# Patient Record
Sex: Male | Born: 1975 | Race: White | Hispanic: Yes | Marital: Single | Smoking: Current every day smoker
Health system: Southern US, Community
[De-identification: ages and names within clinical notes are randomized; demographics above are authoritative.]

---

## 2014-03-01 ENCOUNTER — Emergency Department (HOSPITAL_BASED_OUTPATIENT_CLINIC_OR_DEPARTMENT_OTHER)
Admission: EM | Admit: 2014-03-01 | Discharge: 2014-03-01 | Disposition: A | Discharge status: Home / Self Care | Payer: 59 | Attending: Emergency Medicine | Admitting: Emergency Medicine

## 2014-03-01 ENCOUNTER — Encounter (HOSPITAL_BASED_OUTPATIENT_CLINIC_OR_DEPARTMENT_OTHER): Payer: Self-pay | Admitting: Emergency Medicine

## 2014-03-01 ENCOUNTER — Emergency Department (HOSPITAL_BASED_OUTPATIENT_CLINIC_OR_DEPARTMENT_OTHER): Payer: 59

## 2014-03-01 DIAGNOSIS — K5289 Other specified noninfective gastroenteritis and colitis: Secondary | ICD-10-CM | POA: Insufficient documentation

## 2014-03-01 DIAGNOSIS — Z880 Allergy status to penicillin: Secondary | ICD-10-CM | POA: Insufficient documentation

## 2014-03-01 DIAGNOSIS — F17200 Nicotine dependence, unspecified, uncomplicated: Secondary | ICD-10-CM | POA: Insufficient documentation

## 2014-03-01 DIAGNOSIS — K529 Noninfective gastroenteritis and colitis, unspecified: Secondary | ICD-10-CM

## 2014-03-01 LAB — URINALYSIS, ROUTINE W REFLEX MICROSCOPIC
Bilirubin Urine: NEGATIVE
Glucose, UA: NEGATIVE mg/dL
Ketones, ur: NEGATIVE mg/dL
LEUKOCYTES UA: NEGATIVE
Nitrite: NEGATIVE
Protein, ur: NEGATIVE mg/dL
SPECIFIC GRAVITY, URINE: 1.019 (ref 1.005–1.030)
UROBILINOGEN UA: 0.2 mg/dL (ref 0.0–1.0)
pH: 7.5 (ref 5.0–8.0)

## 2014-03-01 LAB — CBC WITH DIFFERENTIAL/PLATELET
Basophils Absolute: 0 10*3/uL (ref 0.0–0.1)
Basophils Relative: 0 % (ref 0–1)
EOS ABS: 0.3 10*3/uL (ref 0.0–0.7)
EOS PCT: 4 % (ref 0–5)
HEMATOCRIT: 43.1 % (ref 39.0–52.0)
Hemoglobin: 15.2 g/dL (ref 13.0–17.0)
Lymphocytes Relative: 39 % (ref 12–46)
Lymphs Abs: 2.9 10*3/uL (ref 0.7–4.0)
MCH: 29.4 pg (ref 26.0–34.0)
MCHC: 35.3 g/dL (ref 30.0–36.0)
MCV: 83.4 fL (ref 78.0–100.0)
MONO ABS: 0.7 10*3/uL (ref 0.1–1.0)
Monocytes Relative: 9 % (ref 3–12)
Neutro Abs: 3.7 10*3/uL (ref 1.7–7.7)
Neutrophils Relative %: 49 % (ref 43–77)
Platelets: 231 10*3/uL (ref 150–400)
RBC: 5.17 MIL/uL (ref 4.22–5.81)
RDW: 12.4 % (ref 11.5–15.5)
WBC: 7.5 10*3/uL (ref 4.0–10.5)

## 2014-03-01 LAB — COMPREHENSIVE METABOLIC PANEL
ALT: 49 U/L (ref 0–53)
AST: 26 U/L (ref 0–37)
Albumin: 4 g/dL (ref 3.5–5.2)
Alkaline Phosphatase: 82 U/L (ref 39–117)
Anion gap: 11 (ref 5–15)
BUN: 12 mg/dL (ref 6–23)
CALCIUM: 9.4 mg/dL (ref 8.4–10.5)
CO2: 27 meq/L (ref 19–32)
CREATININE: 0.9 mg/dL (ref 0.50–1.35)
Chloride: 103 mEq/L (ref 96–112)
Glucose, Bld: 103 mg/dL — ABNORMAL HIGH (ref 70–99)
Potassium: 4.5 mEq/L (ref 3.7–5.3)
SODIUM: 141 meq/L (ref 137–147)
TOTAL PROTEIN: 7.4 g/dL (ref 6.0–8.3)
Total Bilirubin: 0.4 mg/dL (ref 0.3–1.2)

## 2014-03-01 LAB — LIPASE, BLOOD: LIPASE: 18 U/L (ref 11–59)

## 2014-03-01 LAB — URINE MICROSCOPIC-ADD ON

## 2014-03-01 MED ORDER — METRONIDAZOLE 500 MG PO TABS: 500.0000 mg | ORAL_TABLET | Freq: Two times a day (BID) | ORAL | Status: AC

## 2014-03-01 MED ORDER — METRONIDAZOLE 500 MG PO TABS
500.0000 mg | ORAL_TABLET | Freq: Two times a day (BID) | ORAL | Status: DC
Start: 2014-03-01 — End: 2014-03-01

## 2014-03-01 MED ORDER — CIPROFLOXACIN HCL 500 MG PO TABS: 500.0000 mg | ORAL_TABLET | Freq: Two times a day (BID) | ORAL | Status: AC

## 2014-03-01 MED ORDER — CIPROFLOXACIN HCL 500 MG PO TABS: 500.0000 mg | ORAL_TABLET | Freq: Two times a day (BID) | ORAL | Status: DC

## 2014-03-01 NOTE — Discharge Instructions (Signed)
Colitis °Colitis is inflammation of the colon. Colitis can be a short-term or long-standing (chronic) illness. Crohn's disease and ulcerative colitis are 2 types of colitis which are chronic. They usually require lifelong treatment. °CAUSES  °There are many different causes of colitis, including: °· Viruses. °· Germs (bacteria). °· Medicine reactions. °SYMPTOMS  °· Diarrhea. °· Intestinal bleeding. °· Pain. °· Fever. °· Throwing up (vomiting). °· Tiredness (fatigue). °· Weight loss. °· Bowel blockage. °DIAGNOSIS  °The diagnosis of colitis is based on examination and stool or blood tests. X-rays, CT scan, and colonoscopy may also be needed. °TREATMENT  °Treatment may include: °· Fluids given through the vein (intravenously). °· Bowel rest (nothing to eat or drink for a period of time). °· Medicine for pain and diarrhea. °· Medicines (antibiotics) that kill germs. °· Cortisone medicines. °· Surgery. °HOME CARE INSTRUCTIONS  °· Get plenty of rest. °· Drink enough water and fluids to keep your urine clear or pale yellow. °· Eat a well-balanced diet. °· Call your caregiver for follow-up as recommended. °SEEK IMMEDIATE MEDICAL CARE IF:  °· You develop chills. °· You have an oral temperature above 102° F (38.9° C), not controlled by medicine. °· You have extreme weakness, fainting, or dehydration. °· You have repeated vomiting. °· You develop severe belly (abdominal) pain or are passing bloody or tarry stools. °MAKE SURE YOU:  °· Understand these instructions. °· Will watch your condition. °· Will get help right away if you are not doing well or get worse. °Document Released: 09/21/2004 Document Revised: 11/06/2011 Document Reviewed: 12/17/2009 °ExitCare® Patient Information ©2015 [employer]. This information is not intended to replace advice given to you by your health care provider. Make sure you discuss any questions you have with your health care provider. ° °Emergency Department Resource Guide °1) Find a Doctor and  Pay Out of Pocket °Although you won't have to find out who is covered by your insurance plan, it is a good idea to ask around and get recommendations. You will then need to call the office and see if the doctor you have chosen will accept you as a new patient and what types of options they offer for patients who are self-pay. Some doctors offer discounts or will set up payment plans for their patients who do not have insurance, but you will need to ask so you aren't surprised when you get to your appointment. ° °2) Contact Your Local Health Department °Not all health departments have doctors that can see patients for sick visits, but many do, so it is worth a call to see if yours does. If you don't know where your local health department is, you can check in your phone book. The CDC also has a tool to help you locate your state's health department, and many state websites also have listings of all of their local health departments. ° °3) Find a Walk-in Clinic °If your illness is not likely to be very severe or complicated, you may want to try a walk in clinic. These are popping up all over the country in pharmacies, drugstores, and shopping centers. They're usually staffed by nurse practitioners or physician assistants that have been trained to treat common illnesses and complaints. They're usually fairly quick and inexpensive. However, if you have serious medical issues or chronic medical problems, these are probably not your best option. ° °No Primary Care Doctor: °- Call [facility] at  832-8000 - they can help you locate a primary care doctor that  accepts your   insurance, provides certain services, etc. °- Physician Referral Service- 1-800-533-3463 ° °Chronic Pain Problems: °Organization         Address  Phone   Notes  °[facility]  (336) 297-2271 Patients need to be referred by their primary care doctor.  ° °Medication Assistance: °Organization         Address  Phone   Notes  °[place] [facility] [address] °[place], [place] [address] (336) 641-8030 --Must be a resident of [place] °-- Must have NO insurance coverage whatsoever (no Medicaid/ Medicare, etc.) °-- The pt. MUST have a primary care doctor that directs their care regularly and follows them in the community °  °[facility]  (866) 331-1348   °[employer]  (888) 892-1162   ° °Agencies that provide inexpensive medical care: °Organization         Address  Phone   Notes  °[facility]  (336) 832-8035   °[facility]    (336) 832-7272   °[facility] [address] °[place], [place] [address] (336) 832-4777   °[facility] [address], °[place] (336) 271-4999   °[facility]    (336) 373-0678   °[facility]    (336) 272-1050   °[facility] ° [address], [place] Phone:  (336) 832-4444, Fax:  (336) 832-4440 Hours of Operation:  9 am - 6 pm, M-F.  Also accepts Medicaid/Medicare and self-pay.  °[facility] ° [address], [place] Phone: (336) 832-3150, Fax: (336) 832-3151. Hours of Operation:  8:30 am - 5:30 pm, M-F.  Also accepts Medicaid and self-pay.  °[facility] [place] [address], [place] Phone: (336) 878-6027   °[facility] [address], [place], [place] (336)723-1848, Ext. 123 Mondays & Thursdays: 7-9 AM.  First 15 patients are seen on a first come, first serve basis. °  ° °Medicaid-accepting [place] Providers: ° °Organization         Address  Phone   Notes  °[facility] [address], [place] (336) 641-2100 Also accepts self-pay patients.  °[facility] [address], [place] ° (336) 856-9996   °[facility] [address], [place] (336) 288-8857   °[facility] [address] [place] [address], [place] (336) 299-7000   °Veita Bland [address], [place]  ° (336) 373-1557 Only accepts [place] Access Medicaid patients after they have their name applied to their card.  ° °Self-Pay (no insurance) in [place]: ° °Organization         Address  Phone   Notes  °Sickle Cell Patients, [facility] [address], [place] (336) 832-1970   °[facility] [address], [place] (336) 832-4400   °[facility] ° [address], [place] (336) 992-4800   °[facility]/Dr. Osei-Bonsu ° [address] [place] [address], [place] or [address], [place] (336) 841-8500 Phone number for both [place] and [place] locations is the same.  °[facility] [address], [place] (336) 299-0000   °[facility] [address] [place] [address], [place] or [address] (336) 852-7530 °(336) 878-2260   °[facility] [address], [place] (336) 350-1642, phone; (336) 294-5005, fax Sees patients 1st and 3rd Saturday of every month.  Must not qualify for public or private insurance (i.e. Medicaid, Medicare, [place] Health Choice, Veterans' Benefits) • Household income should be no more than 200% of the poverty level •The clinic cannot treat you if you are pregnant or think you are pregnant • Sexually transmitted diseases are not treated at the clinic.  ° ° °Dental Care: °Organization         Address  Phone  Notes  °[facility] [address], [place] (336) 641-6152 Accepts children up to age 21 who are enrolled in Medicaid or [place] Health Choice; pregnant women with a Medicaid card; and children who have applied for Medicaid or [place] Health Choice, but were declined, whose parents can pay a reduced fee at time of service.  °[facility] [place]  [address], [place] (336) 641-7733 Accepts children up to age 21 who are enrolled in Medicaid or [place] Health Choice; pregnant women with a Medicaid card; and children who have applied for Medicaid or [place] Health Choice, but were declined, whose parents can pay a reduced fee at time of service.  °[facility] ° [address], [place] (336) 641-4533 Patients are seen by appointment only. Walk-ins are not accepted. [facility] will see patients 18 years of age and older. °Monday - Tuesday (8am-5pm) °Most Wednesdays (8:30-5pm) °$30 per visit, cash only  °[facility] ° [address], [place] (336) 641-4533 Patients are seen by appointment only. Walk-ins are not accepted. [facility] will see patients 18 years of age and older. °One Wednesday Evening (Monthly: Volunteer Based).  $30 per visit, cash only  °[facility]  (919) 537-3737 for adults; Children under age 4, call Graduate Pediatric Dentistry at (919) [phone]. Children aged 4-14, please call (919) [phone] to request a pediatric application. ° Dental services are provided in all areas of dental care including fillings, crowns and bridges, complete and partial dentures, implants, gum treatment, root canals, and extractions. Preventive care is also provided. Treatment is provided to both adults and children. °Patients are selected via a lottery and there is often a waiting list. °  °[facility] [address], °[place] ° (336) 763-8833 [email or web address] °  °[facility] [address], [place], [place] (336)723-1848, Ext. 123 Second and Fourth Thursday of each month, opens at 6:30 AM; Clinic ends at 9 AM.  Patients are seen on a first-come first-served basis, and a limited number are seen during each clinic.  ° °[facility] ° [address], [place], [place] (336) 723-7904   Eligibility Requirements °You must have lived in [place], [place], or  [place] counties for at least the last three months. °  You cannot be eligible for state or federal sponsored healthcare insurance, including Veterans Administration, Medicaid, or Medicare. °  You generally cannot be eligible for healthcare insurance through your employer.  °  How to apply: °Eligibility screenings are held every Tuesday and Wednesday afternoon from 1:00 pm until 4:00 pm. You do not need an appointment for the interview!  °[facility] [address], [place],  336-631-2330   °[facility]  336-342-8273   °[facility]  336-703-3100   °[facility]  336-570-6415   ° °  Behavioral Health Resources in the Community: °Intensive Outpatient Programs °Organization         Address  Phone  Notes  °[facility] [address], [place], [place] 336-878-6098   °[facility] [address], [place], [place] 336-832-9800   °[facility] [address], [place], [place] ° 336-882-2125   °[facility] [address],  °[place], [place] 1-800-853-5163 or 336-641-4981   °Substance Abuse Resources °Organization         Address  Phone  Notes  °[facility]  336-882-2125   °[facility]  336-784-9470   °[facility]  336-285-9073   °[facility]  336-845-3988   °Residential & Outpatient Substance Abuse Program  1-800-659-3381   °Psychological Services °Organization         Address  Phone  Notes  °[facility]  336- 832-9600   °[facility]  336- 378-7881   °[facility] [address], [place] 1-800-853-5163 or 336-641-4981   ° °Mobile Crisis Teams °Organization         Address  Phone  Notes  °[facility], Mobile Crisis Care Unit  1-877-626-1772   °Assertive °[facility] ° [address] [place], [place] 336-834-9664   °Sharon DeEsch [address] °[place] 336-554-5454   ° °Self-Help/Support Groups °Organization         Address  Phone             Notes  °[facility] [place] - variety of support groups  336- 373-1402 Call for more information  °Narcotics Anonymous (NA), [facility] [address], °[place]  2 meetings at this location  ° °Residential Treatment Programs °Organization         Address  Phone  Notes  °[facility] [address],    °[place]  1-866-801-8205   °[facility] ° [address], [place], [place] 704-293-8524   °[facility] [address], [place] 336-845-3988 Admissions: 8am-3pm M-F  °[facility] [address]    °[place], [place] 336-841-1104   °[facility] [address], [place], [place] 336-379-7146   °[facility] [address]  °[place], [place] 336-285-9073   °[facility] Intensive Outpatient [address], [place], [place] 336-852-3033   °[facility] [address]  °[place], [place] 1-877-615-2722 or 336-784-9470   °[facility] [address] [place], [place] 336-227-7417 Accepts Medicaid  °[facility] [address]  ° [place] 1-800-659-3381 Substance Abuse/Addiction Treatment  ° °[place] Behavioral Health Resources °Organization         Address  Phone  Notes  °[facility]  (888) 581-9988   °Julie Brannon, PhD [address] [place], [place]   (336) 349-5553 or (336) 951-0000   °[facility]   [address] °[place], [place] (336) 349-4454   °[facility] [address], [place], [place] (336) 342-8316 Insurance/Medicaid/sponsorship through [facility]  °[facility] [address]                                    [place], [place] (336) 342-8316 Therapy/tele-psych/case  °[facility] [address]  ° [place],   [place] (336) 349-2233     °Dr. Arfeen  (336) 349-4544   °[facility] [place]  [employer] [place] [facility] 1) [address], [place] °2) [address], [place] °3)  [address], [place] (336) 349-3220 °(336) 342-7768 ° °(336) 342-8140   °[place] [facility] (336) 342-1394 or (336) 342-3537 (After Hours)    ° ° °

## 2014-03-01 NOTE — ED Notes (Signed)
Patient transported to CT 

## 2014-03-01 NOTE — ED Provider Notes (Signed)
CSN: [id number]     Arrival date & time 03/01/14  [id number] History   First MD Initiated Contact with Patient 03/01/14 1056     Chief Complaint  Patient presents with  . Abdominal Pain     (Consider location/radiation/quality/duration/timing/severity/associated sxs/prior Treatment) Patient is a 38 y.o. male presenting with abdominal pain.  Abdominal Pain Pain location:  LLQ Pain quality: cramping   Pain radiates to:  Does not radiate Pain severity:  Moderate Onset quality:  Sudden Duration:  2 days Timing:  Intermittent Progression:  Waxing and waning Context: not eating, not recent travel, not retching, not sick contacts and not trauma   Relieved by:  Nothing Worsened by:  Position changes Ineffective treatments:  OTC medications Associated symptoms: no chills, no diarrhea, no dysuria, no fever, no hematochezia, no hematuria, no nausea and no vomiting     History reviewed. No pertinent past medical history. History reviewed. No pertinent past surgical history. No family history on file. History  Substance Use Topics  . Smoking status: Current Every Day Smoker -- 0.50 packs/day    Types: Cigarettes  . Smokeless tobacco: Not on file  . Alcohol Use: Yes    Review of Systems  Constitutional: Negative for fever and chills.  Gastrointestinal: Positive for abdominal pain. Negative for nausea, vomiting, diarrhea and hematochezia.  Genitourinary: Negative for dysuria and hematuria.      Allergies  Penicillins  Home Medications   Prior to Admission medications   Medication Sig Start Date End Date Taking? Authorizing Provider  ciprofloxacin (CIPRO) 500 MG tablet Take 1 tablet (500 mg total) by mouth 2 (two) times daily. 03/01/14   Jacquelin Hawkingalph Anaja Monts, MD  metroNIDAZOLE (FLAGYL) 500 MG tablet Take 1 tablet (500 mg total) by mouth 2 (two) times daily. 03/01/14   Jacquelin Hawkingalph Reniyah Gootee, MD   BP 108/73  Pulse 56  Temp(Src) 98 F (36.7 C) (Oral)  Resp 16  Ht 5\' 3"  (1.6 m)  Wt 178 lb (80.74 kg)   BMI 31.54 kg/m2  SpO2 100% Physical Exam  Constitutional: He is oriented to person, place, and time. He appears well-developed and well-nourished.  Eyes: Conjunctivae and EOM are normal.  Cardiovascular: Normal rate, regular rhythm and normal heart sounds.   Pulmonary/Chest: Effort normal and breath sounds normal.  Abdominal: Soft. Normal appearance and bowel sounds are normal. There is tenderness in the left lower quadrant. There is no rebound, no guarding and no CVA tenderness.  Neurological: He is alert and oriented to person, place, and time.  Skin: Skin is warm and dry.    ED Course  Procedures (including critical care time) Medications - No data to display Labs Review Labs Reviewed  COMPREHENSIVE METABOLIC PANEL - Abnormal; Notable for the following:    Glucose, Bld 103 (*)    All other components within normal limits  URINALYSIS, ROUTINE W REFLEX MICROSCOPIC - Abnormal; Notable for the following:    Hgb urine dipstick MODERATE (*)    All other components within normal limits  URINE MICROSCOPIC-ADD ON - Abnormal; Notable for the following:    Bacteria, UA FEW (*)    All other components within normal limits  CBC WITH DIFFERENTIAL  LIPASE, BLOOD    Imaging Review Ct Abdomen Pelvis Wo Contrast  03/01/2014   CLINICAL DATA:  Three-day history of left lower quadrant pain and hematuria  EXAM: CT ABDOMEN AND PELVIS WITHOUT CONTRAST  TECHNIQUE: Multidetector CT imaging of the abdomen and pelvis was performed following the standard protocol without IV contrast.  COMPARISON:  None.  FINDINGS: The kidneys are normal in density and contour. There are no calcified stones nor assure hydronephrosis. The perinephric fat is normal. Along the course of the ureters no calcified stones are demonstrated. The urinary bladder, prostate gland, and seminal vesicles are normal.  The stomach, small bowel, and colon to the mid descending portion are normal. The appendix is normal. There is short segment of  inflammatory change adjacent to the distal descending colon at its junction with the rectosigmoid. This may reflect focal colitis or diverticulitis. There is no evidence of abscess or free fluid or free air.  The liver, gallbladder, pancreas, spleen, and adrenal glands are normal. The abdominal aorta and surrounding soft tissues are normal. The lumbar spine and bony pelvis are unremarkable. S1 is transitional. The lung bases are clear.  IMPRESSION: 1. Focal inflammatory change at the junction of the descending colon with the rectosigmoid is consistent with focal colitis or diverticulitis. There is no obstruction or perforation or abscess formation. 2. There is no urinary tract stone nor evidence of obstruction. 3. There is no acute hepatobiliary abnormality.   Electronically Signed   By: David  [place]   On: 03/01/2014 12:47     EKG Interpretation None      MDM   Final diagnoses:  Colitis   Patient afebrile. Imaging shows colitis. CBC and BMP unremarkable. Urinalysis with hematuria most likely from adjacent inflammation to ureter. Will prescribe ciprofloxacin and Flagyl. Instructed patient to follow-up with GI. Given resources for follow-up. Also instructed to follow-up with PCP as he may need referral. Patient understands and agrees with plan. Patient stable for discharge home.    Jacquelin Hawkingalph Tarea Skillman, MD 03/01/14 2117

## 2014-03-01 NOTE — ED Notes (Signed)
Patient here with complaint of LUQ,LLQ abdominal pain and abdominal distention x 2 days, denies constipation. No nausea, no diarrhea.  etoh use weekly.

## 2014-03-04 NOTE — ED Provider Notes (Signed)
I saw and evaluated the patient, reviewed the resident's note and I agree with the findings and plan.   .Face to face Exam:  General:  Awake HEENT:  Atraumatic Resp:  Normal effort Abd:  Nondistended Neuro:No focal weakness  Delando Satter L Else Habermann, MD 03/04/14 1521 

## 2015-12-23 IMAGING — CT CT ABD-PELV W/O CM
2 of 4 series · 15 of 46 positions shown, 17 images · non-contrast
Comparison: None.

CLINICAL DATA: Three-day history of left lower quadrant pain and
hematuria

EXAM:
CT ABDOMEN AND PELVIS WITHOUT CONTRAST
TECHNIQUE: Multidetector CT imaging of the abdomen and pelvis was performed
following the standard protocol without IV contrast.

[Series 2: renal stone < 200 lbs 5.0 b31f · axial · 0.73mm/px · z∈[-441,+34]mm · 12 of 105 slices shown, 14 images]
[im 5/105  soft-tissue]
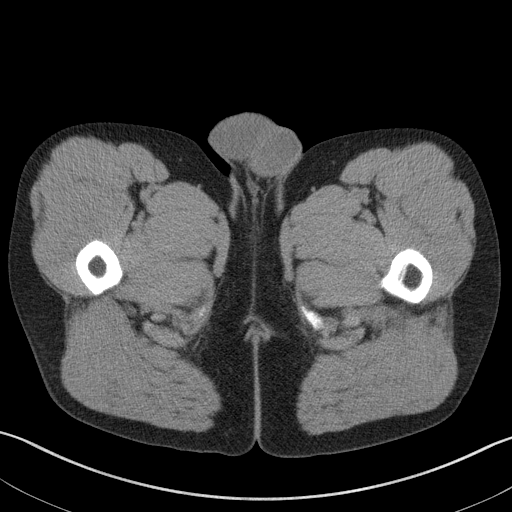
[im 5/105  bone]
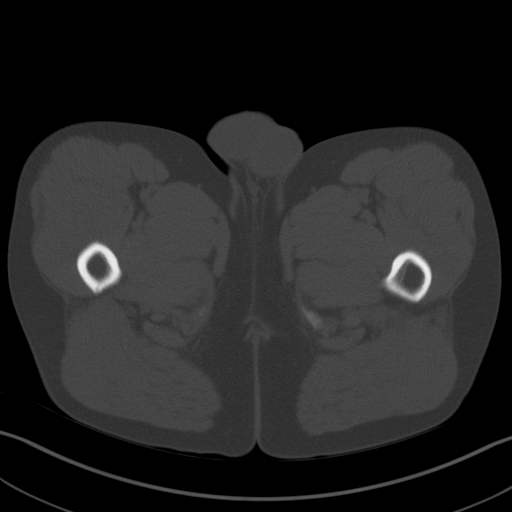
[im 14/105  soft-tissue]
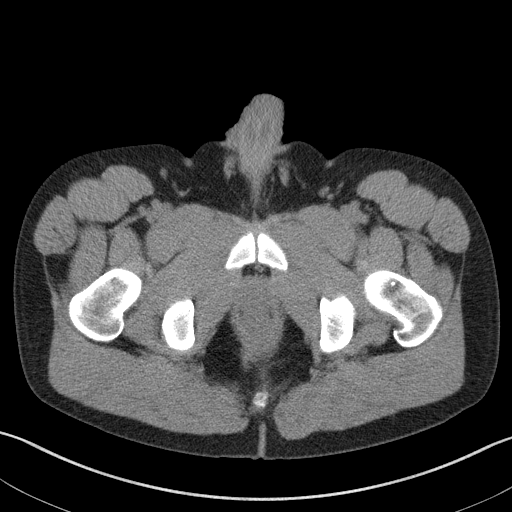
[im 23/105  soft-tissue]
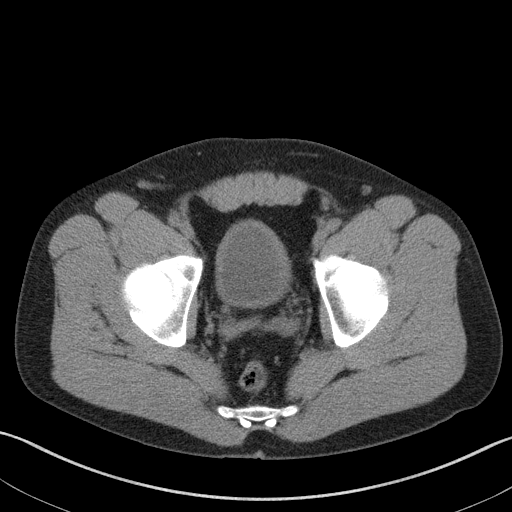
[im 32/105  soft-tissue]
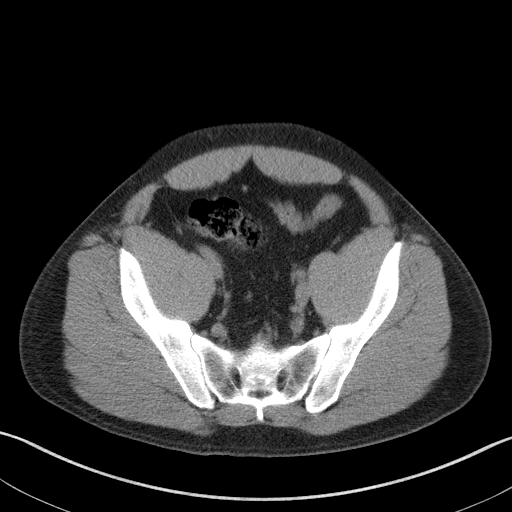
[im 41/105  soft-tissue]
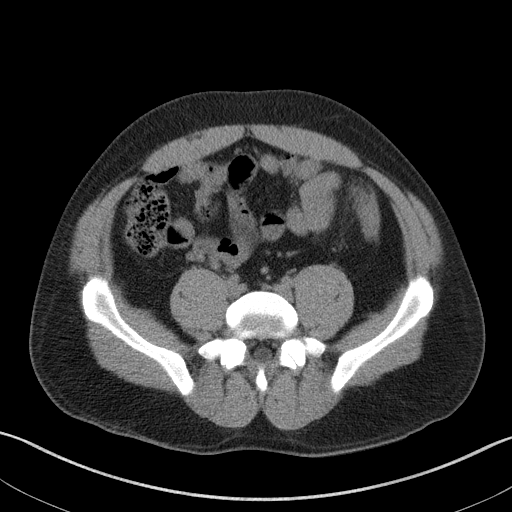
[im 50/105  soft-tissue]
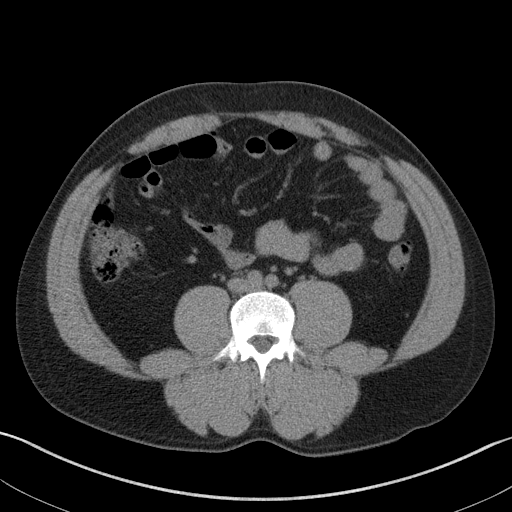
[im 55/105  soft-tissue]
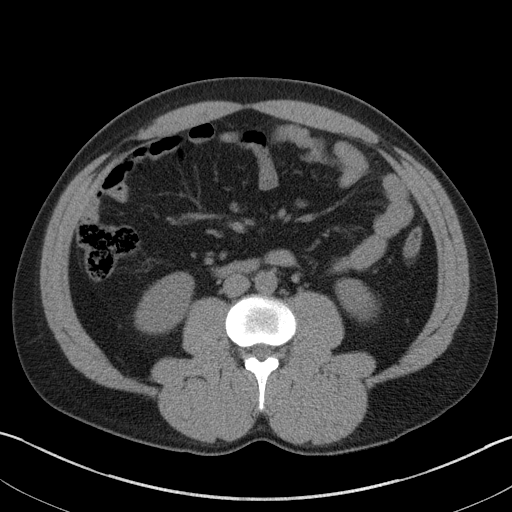
[im 64/105  soft-tissue]
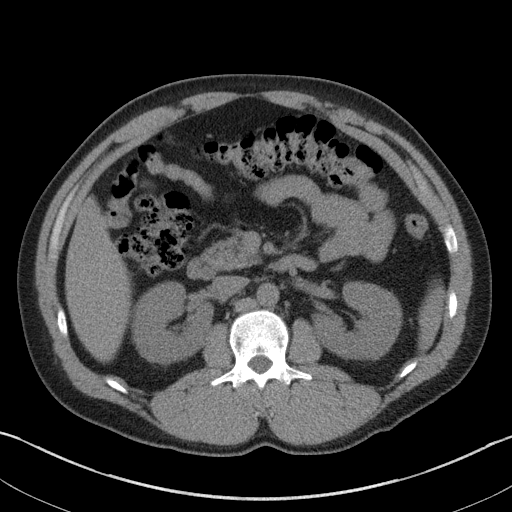
[im 73/105  soft-tissue]
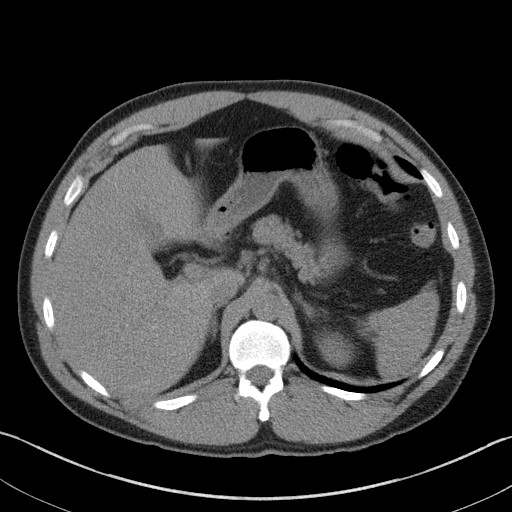
[im 73/105  bone]
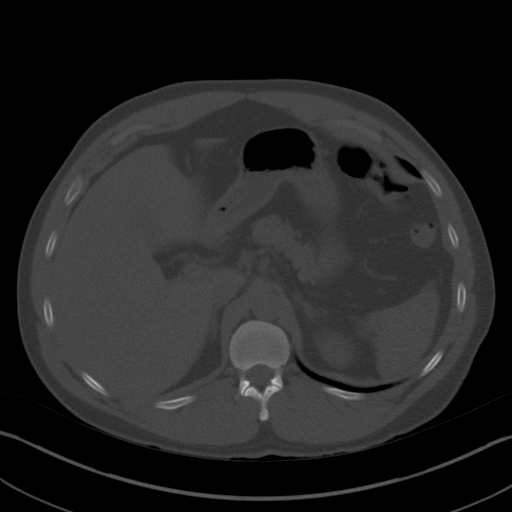
[im 82/105  soft-tissue]
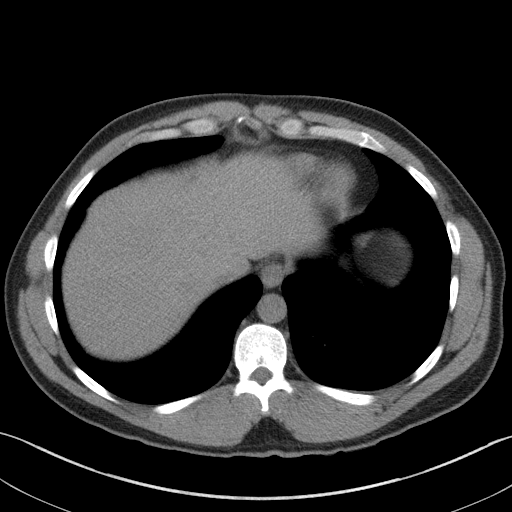
[im 91/105  soft-tissue]
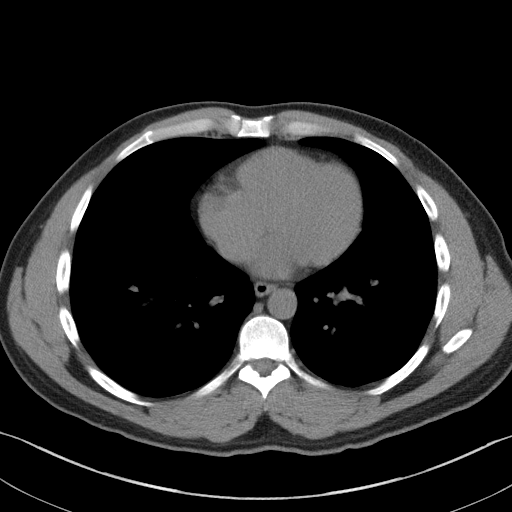
[im 100/105  soft-tissue]
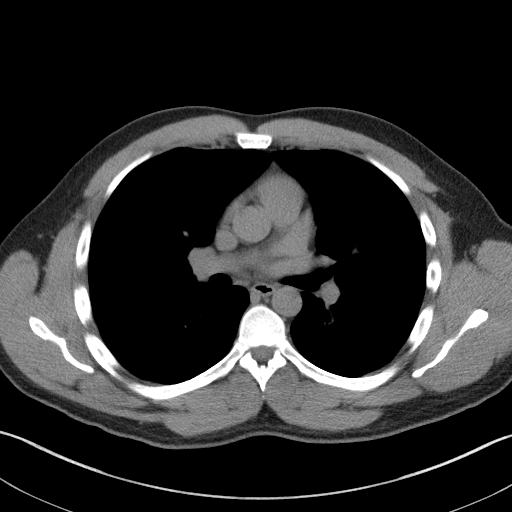

[Series 5: renal stone 3.0 coronal · coronal · 0.70mm/px · 3 of 74 slices shown]
[im 25/74  soft-tissue]
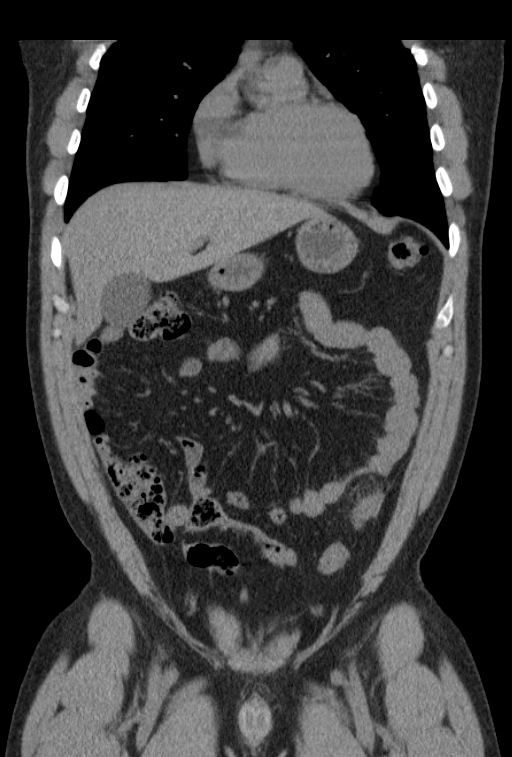
[im 33/74  soft-tissue]
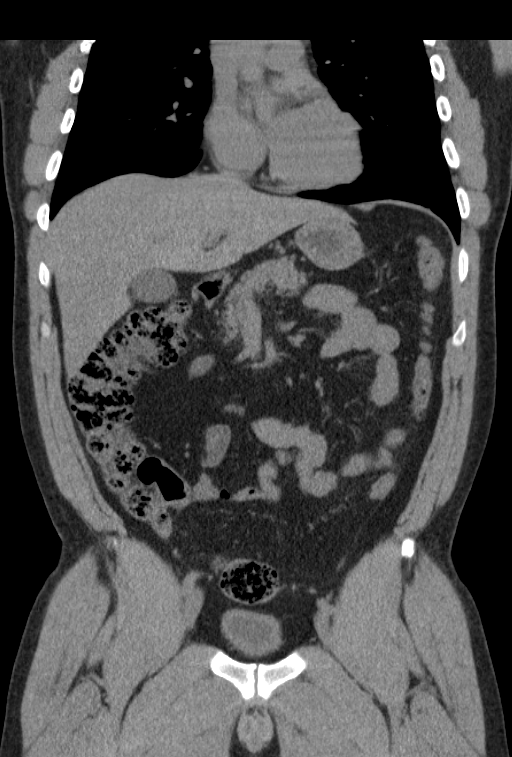
[im 41/74  soft-tissue]
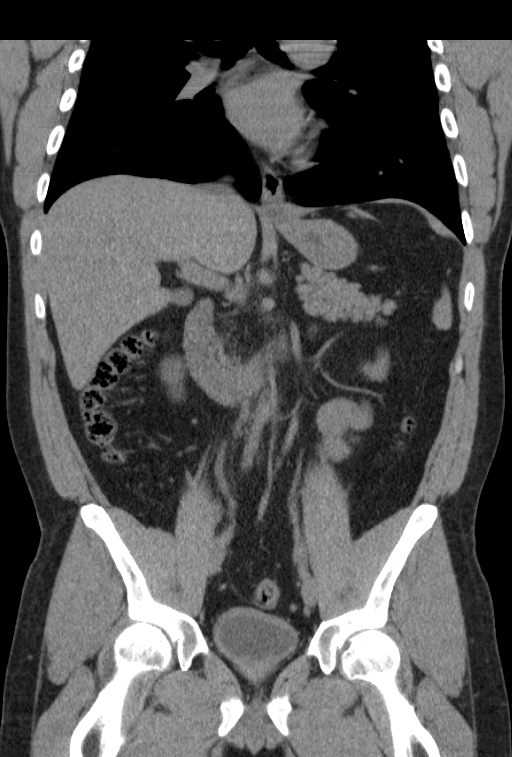

[15 of 46 positions shown; findings below may reference images not displayed]

FINDINGS: The kidneys are normal in density and contour. There are no
calcified stones nor assure hydronephrosis. The perinephric fat is
normal. Along the course of the ureters no calcified stones are
demonstrated. The urinary bladder, prostate gland, and seminal
vesicles are normal.

The stomach, small bowel, and colon to the mid descending portion
are normal. The appendix is normal. There is short segment of
inflammatory change adjacent to the distal descending colon at its
junction with the rectosigmoid. This may reflect focal colitis or
diverticulitis. There is no evidence of abscess or free fluid or
free air.

The liver, gallbladder, pancreas, spleen, and adrenal glands are
normal. The abdominal aorta and surrounding soft tissues are normal.
The lumbar spine and bony pelvis are unremarkable. S1 is
transitional. The lung bases are clear.
IMPRESSION: 1. Focal inflammatory change at the junction of the descending colon
with the rectosigmoid is consistent with focal colitis or
diverticulitis. There is no obstruction or perforation or abscess
formation.
2. There is no urinary tract stone nor evidence of obstruction.
3. There is no acute hepatobiliary abnormality.
